# Patient Record
Sex: Female | Born: 1995 | Race: White | Hispanic: No | Marital: Single | Smoking: Never smoker
Health system: Southern US, Community
[De-identification: ages and names within clinical notes are randomized; demographics above are authoritative.]

---
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
## 2018-09-13 ENCOUNTER — Emergency Department: Payer: BLUE CROSS/BLUE SHIELD

## 2018-09-13 ENCOUNTER — Emergency Department
Admission: EM | Admit: 2018-09-13 | Discharge: 2018-09-13 | Disposition: A | Discharge status: Home / Self Care | Payer: BLUE CROSS/BLUE SHIELD | Attending: Emergency Medicine | Admitting: Emergency Medicine

## 2018-09-13 ENCOUNTER — Other Ambulatory Visit: Payer: Self-pay

## 2018-09-13 ENCOUNTER — Encounter: Payer: Self-pay | Admitting: *Deleted

## 2018-09-13 DIAGNOSIS — W0110XA Fall on same level from slipping, tripping and stumbling with subsequent striking against unspecified object, initial encounter: Secondary | ICD-10-CM | POA: Insufficient documentation

## 2018-09-13 DIAGNOSIS — Z79899 Other long term (current) drug therapy: Secondary | ICD-10-CM | POA: Insufficient documentation

## 2018-09-13 DIAGNOSIS — Y9301 Activity, walking, marching and hiking: Secondary | ICD-10-CM | POA: Diagnosis not present

## 2018-09-13 DIAGNOSIS — S4992XA Unspecified injury of left shoulder and upper arm, initial encounter: Secondary | ICD-10-CM | POA: Diagnosis present

## 2018-09-13 DIAGNOSIS — Y929 Unspecified place or not applicable: Secondary | ICD-10-CM | POA: Diagnosis not present

## 2018-09-13 DIAGNOSIS — S43005A Unspecified dislocation of left shoulder joint, initial encounter: Secondary | ICD-10-CM | POA: Diagnosis not present

## 2018-09-13 DIAGNOSIS — Y999 Unspecified external cause status: Secondary | ICD-10-CM | POA: Diagnosis not present

## 2018-09-13 MED ORDER — OXYCODONE-ACETAMINOPHEN 5-325 MG PO TABS
1.0000 | ORAL_TABLET | Freq: Once | ORAL | Status: AC
  Administered 2018-09-13: 1 via ORAL
  Filled 2018-09-13: qty 1

## 2018-09-13 MED ORDER — LIDOCAINE HCL (PF) 1 % IJ SOLN
20.0000 mL | Freq: Once | INTRAMUSCULAR | Status: AC
  Administered 2018-09-13: 20 mL
  Filled 2018-09-13: qty 20

## 2018-09-13 MED ORDER — ONDANSETRON HCL 4 MG/2ML IJ SOLN
4.0000 mg | Freq: Once | INTRAMUSCULAR | Status: AC
  Administered 2018-09-13: 4 mg via INTRAVENOUS
  Filled 2018-09-13: qty 2

## 2018-09-13 MED ORDER — MORPHINE SULFATE (PF) 4 MG/ML IV SOLN
4.0000 mg | Freq: Once | INTRAVENOUS | Status: AC
  Administered 2018-09-13: 4 mg via INTRAVENOUS
  Filled 2018-09-13: qty 1

## 2018-09-13 NOTE — Discharge Instructions (Signed)
Take ibuprofen or tylenol for pain. Apply ice.

## 2018-09-13 NOTE — ED Provider Notes (Signed)
[facility] Emergency Department Provider Note  ____________________________________________  Time seen: Approximately 6:02 PM  I have reviewed the triage vital signs and the nursing notes.   HISTORY  Chief Complaint Shoulder Injury    HPI Renee Richardson is a 23 y.o. female that presents to the emergency department that presents to the emergency department for evaluation of shoulder pain after fall today. Patient was walking on pavement when she tripped and fell due to a hole in her shoe. She landed directly on her shoulder. She cannot move her shoulder. No additional injuries.    History reviewed. No pertinent past medical history.  There are no active problems to display for this patient.   History reviewed. No pertinent surgical history.  Prior to Admission medications   Medication Sig Start Date End Date Taking? Authorizing Provider  meloxicam (MOBIC) 15 MG tablet Take one a day for 7 days then as needed 10/15/15   Ralene Corkraper, Timothy R, DO  norethindrone-ethinyl estradiol (MICROGESTIN,JUNEL,LOESTRIN) 1-20 MG-MCG tablet Take 1 tablet by mouth daily. 10/12/15   [provider]    Allergies Patient has no known allergies.  History reviewed. No pertinent family history.  Social History Social History   Tobacco Use  . Smoking status: Never Smoker  . Smokeless tobacco: Never Used  Substance Use Topics  . Alcohol use: Not on file  . Drug use: Not on file     Review of Systems  Cardiovascular: No chest pain. Respiratory: No SOB. Gastrointestinal: No nausea, no vomiting.  Musculoskeletal: Positive for shoulder pain.  Skin: Negative for rash, abrasions, lacerations, ecchymosis. Neurological: Negative for numbness or tingling   ____________________________________________   PHYSICAL EXAM:  VITAL SIGNS: ED Triage Vitals  Enc Vitals Group     BP 09/13/18 1736 (!) 150/89     Pulse Rate 09/13/18 1736 82     Resp 09/13/18 1736 18   Temp 09/13/18 1736 98 F (36.7 C)     Temp Source 09/13/18 1736 Oral     SpO2 09/13/18 1736 100 %     Weight 09/13/18 1736 125 lb (56.7 kg)     Height 09/13/18 1736 5\' 2"  (1.575 m)     Head Circumference --      Peak Flow --      Pain Score 09/13/18 1741 8     Pain Loc --      Pain Edu? --      Excl. in GC? --      Constitutional: Alert and oriented. Well appearing and in no acute distress. Eyes: Conjunctivae are normal. PERRL. EOMI. Head: Atraumatic. ENT:      Ears:      Nose: No congestion/rhinnorhea.      Mouth/Throat: Mucous membranes are moist.  Neck: No stridor.   Cardiovascular: Normal rate, regular rhythm.  Good peripheral circulation. Respiratory: Normal respiratory effort without tachypnea or retractions. Lungs CTAB. Good air entry to the bases with no decreased or absent breath sounds. Musculoskeletal: Full range of motion to all extremities. No gross deformities appreciated.  Limited range of motion of left shoulder due to pain. Neurologic:  Normal speech and language. No gross focal neurologic deficits are appreciated.  Skin:  Skin is warm, dry and intact. No rash noted. Psychiatric: Mood and affect are normal. Speech and behavior are normal. Patient exhibits appropriate insight and judgement.   ____________________________________________   LABS (all labs ordered are listed, but only abnormal results are displayed)  Labs Reviewed - No data to display ____________________________________________  EKG   ____________________________________________  RADIOLOGY Lexine Baton, personally viewed and evaluated these images (plain radiographs) as part of my medical decision making, as well as reviewing the written report by the radiologist.  Dg Shoulder Left  Result Date: 09/13/2018 CLINICAL DATA:  Patient tripped and fell earlier today landing on shoulder and now experiencing pain. EXAM: LEFT SHOULDER - 2+ VIEW COMPARISON:  10/15/2015 FINDINGS: Anterior  subcoracoid left humeral head dislocation is identified. The [facility] joint is maintained. The adjacent ribs and lung are nonacute. IMPRESSION: Anterior subcoracoid left humeral head dislocation. Electronically Signed   By: Tollie Eth M.D.   On: 09/13/2018 18:30    ____________________________________________    PROCEDURES  Procedure(s) performed:    Procedures     ____________________________________________   INITIAL IMPRESSION / ASSESSMENT AND PLAN / ED COURSE  Pertinent labs & imaging results that were available during my care of the patient were reviewed by me and considered in my medical decision making (see chart for details).  Review of the [place] CSRS was performed in accordance of the [facility] prior to dispensing any controlled drugs.   Patient's diagnosis is consistent with dislocation. Xray consistent with dislocation. Patient will be transferred to main ED for reduction. Report was given to Dr. Don Perking.        ____________________________________________  FINAL CLINICAL IMPRESSION(S) / ED DIAGNOSES  Final diagnoses:  None      NEW MEDICATIONS STARTED DURING THIS VISIT:  ED Discharge Orders    None          This chart was dictated using voice recognition software/Dragon. Despite best efforts to proofread, errors can occur which can change the meaning. Any change was purely unintentional.    Enid Derry, PA-C 09/13/18 1856    Don Perking, [place], MD 09/13/18 [phone]

## 2018-09-13 NOTE — ED Triage Notes (Signed)
Pt to ED reporting a fall earlier today. Pt now unable to move her left arm without increased pain. No hx of shoulder dislocation. No obvious deformity at this time. Radial pulse intact and left arm color is appropriate.

## 2018-09-13 NOTE — ED Notes (Signed)
Patient transported to X-ray 

## 2018-09-13 NOTE — ED Provider Notes (Signed)
[facility] Emergency Department Provider Note  ____________________________________________  Time seen: Approximately 6:53 PM  I have reviewed the triage vital signs and the nursing notes.   HISTORY  Chief Complaint Shoulder Injury   HPI Renee Richardson is a 23 y.o. female no significant past medical history who presents for evaluation of left shoulder pain.  Patient reports that she tripped and fell onto her shoulder just prior to arrival.  She is complaining of moderate constant pain worse with movement of the shoulder.  She denies head trauma or LOC.  No neck pain or back pain.  She denies any prior dislocation of her shoulder.  PMH None - reviewed  Prior to Admission medications   Medication Sig Start Date End Date Taking? Authorizing Provider  meloxicam (MOBIC) 15 MG tablet Take one a day for 7 days then as needed 10/15/15   Ralene Corkraper, Timothy R, DO  norethindrone-ethinyl estradiol (MICROGESTIN,JUNEL,LOESTRIN) 1-20 MG-MCG tablet Take 1 tablet by mouth daily. 10/12/15   [provider]    Allergies Patient has no known allergies.  History reviewed. No pertinent family history.  Social History Social History   Tobacco Use  . Smoking status: Never Smoker  . Smokeless tobacco: Never Used  Substance Use Topics  . Alcohol use: Not on file  . Drug use: Not on file    Review of Systems Constitutional: Negative for fever. Eyes: Negative for visual changes. ENT: Negative for facial injury or neck injury Cardiovascular: Negative for chest injury. Respiratory: Negative for shortness of breath. Negative for chest wall injury. Gastrointestinal: Negative for abdominal pain or injury. Genitourinary: Negative for dysuria. Musculoskeletal: Negative for back injury, + L shoulder pain Skin: Negative for laceration/abrasions. Neurological: Negative for head injury.   ____________________________________________   PHYSICAL EXAM:  VITAL SIGNS: ED  Triage Vitals  Enc Vitals Group     BP 09/13/18 1736 (!) 150/89     Pulse Rate 09/13/18 1736 82     Resp 09/13/18 1736 18     Temp 09/13/18 1736 98 F (36.7 C)     Temp Source 09/13/18 1736 Oral     SpO2 09/13/18 1736 100 %     Weight 09/13/18 1736 125 lb (56.7 kg)     Height 09/13/18 1736 5\' 2"  (1.575 m)     Head Circumference --      Peak Flow --      Pain Score 09/13/18 1741 8     Pain Loc --      Pain Edu? --      Excl. in GC? --     Constitutional: Alert and oriented. Well appearing and in no apparent distress. HEENT:      Head: Normocephalic and atraumatic.         Eyes: Conjunctivae are normal. Sclera is non-icteric.       Mouth/Throat: Mucous membranes are moist.       Neck: Supple with no signs of meningismus.  No C-spine tenderness Cardiovascular: Regular rate and rhythm. No murmurs, gallops, or rubs. 2+ symmetrical distal pulses are present in all extremities. No JVD. Respiratory: Normal respiratory effort. Lungs are clear to auscultation bilaterally. No wheezes, crackles, or rhonchi.  Gastrointestinal: Soft, non tender, and non distended with positive bowel sounds. No rebound or guarding. Musculoskeletal: Anterior dislocation of the L shoulder, intact neurovascular exam Neurologic: Normal speech and language. Face is symmetric. Moving all extremities. No gross focal neurologic deficits are appreciated. Skin: Skin is warm, dry and intact. No rash noted.  Psychiatric: Mood and affect are normal. Speech and behavior are normal.  ____________________________________________   LABS (all labs ordered are listed, but only abnormal results are displayed)  Labs Reviewed - No data to display ____________________________________________  EKG  none  ____________________________________________  RADIOLOGY  I have personally reviewed the images performed during this visit and I agree with the Radiologist's read.   Interpretation by Radiologist:  Dg Shoulder  Left  Result Date: 09/13/2018 CLINICAL DATA:  Reduction of shoulder dislocation EXAM: LEFT SHOULDER - 2+ VIEW COMPARISON:  09/13/2018 FINDINGS: Mild persistent anterior/inferior subluxation the left glenohumeral joint, improved from the prior study. No fracture. IMPRESSION: Mild persistent anterior/inferior subluxation of the left glenohumeral joint, improved from the prior study. Electronically Signed   By: Deatra Robinson M.D.   On: 09/13/2018 19:52   Dg Shoulder Left  Result Date: 09/13/2018 CLINICAL DATA:  Patient tripped and fell earlier today landing on shoulder and now experiencing pain. EXAM: LEFT SHOULDER - 2+ VIEW COMPARISON:  10/15/2015 FINDINGS: Anterior subcoracoid left humeral head dislocation is identified. The [facility] joint is maintained. The adjacent ribs and lung are nonacute. IMPRESSION: Anterior subcoracoid left humeral head dislocation. Electronically Signed   By: Tollie Eth M.D.   On: 09/13/2018 18:30     ____________________________________________   PROCEDURES  Procedure(s) performed:yes Reduction of dislocation Date/Time: 09/13/2018 8:41 PM Performed by: Nita Sickle, MD Authorized by: Nita Sickle, MD  Consent: Verbal consent obtained. Risks and benefits: risks, benefits and alternatives were discussed Consent given by: patient Patient understanding: patient states understanding of the procedure being performed Relevant documents: relevant documents present and verified Site marked: the operative site was marked Imaging studies: imaging studies available Required items: required blood products, implants, devices, and special equipment available Patient identity confirmed: verbally with patient Time out: Immediately prior to procedure a "time out" was called to verify the correct patient, procedure, equipment, support staff and site/side marked as required. Preparation: Patient was prepped and draped in the usual sterile fashion. Local anesthesia used:  yes Anesthesia: hematoma block  Anesthesia: Local anesthesia used: yes Local Anesthetic: lidocaine 1% without epinephrine Anesthetic total: 10 mL  Sedation: Patient sedated: no  Patient tolerance: Patient tolerated the procedure well with no immediate complications Comments: Reduced in the prone position with scapular manipulation and downward traction of the humerus.  After reduction reexamination of the joint shows resolution of step-off initially seen.  Patient is able to touch the opposing shoulder.       Critical Care performed:  None ____________________________________________   INITIAL IMPRESSION / ASSESSMENT AND PLAN / ED COURSE   23 y.o. female no significant past medical history who presents for evaluation of left shoulder pain status post mechanical fall.  X-ray confirms anterior dislocation of the left shoulder.  Neurovascular exam is intact.  No other injuries based on history and exam.  Shoulder was reduced per procedure note above  Clinical Course as of Sep 14 2039  Wed Sep 13, 2018  2006 Post reduction x-ray concerning for still mild subluxation of the joint.  On exam the joint seems to be back in place.  Patient is able to touch the opposite shoulder, anatomically looks in place.  I attempted to see if I was able to reduce further unsuccessfully.  I believe that what is seen on XR is actually the lidocaine injected in the joint prior to reduction. Discussed with Dr. Allena Katz, ortho who agrees that shoulder is back in place.  Patient also feels that her arm is back  in place.  At this time will discharge home with a shoulder immobilizer and follow-up with orthopedics.   [CV]    Clinical Course User Index [CV] Don Perking [place], MD     As part of my medical decision making, I reviewed the following data within the electronic MEDICAL RECORD NUMBER Nursing notes reviewed and incorporated, Radiograph reviewed , A consult was requested and obtained from this/these  consultant(s) Orthopedics, Notes from prior ED visits and [place] Controlled Substance Database    Pertinent labs & imaging results that were available during my care of the patient were reviewed by me and considered in my medical decision making (see chart for details).    ____________________________________________   FINAL CLINICAL IMPRESSION(S) / ED DIAGNOSES  Final diagnoses:  Shoulder dislocation, left, initial encounter      NEW MEDICATIONS STARTED DURING THIS VISIT:  ED Discharge Orders    None       Note:  This document was prepared using Dragon voice recognition software and may include unintentional dictation errors.    Nita Sickle, MD 09/13/18 2042

## 2020-04-20 IMAGING — CR DG SHOULDER 2+V*L*
3 series · 3 of 3 positions shown · non-contrast
Comparison: 10/15/2015

CLINICAL DATA: Patient tripped and fell earlier today landing on
shoulder and now experiencing pain.

EXAM:
LEFT SHOULDER - 2+ VIEW

[shoulder grashey]
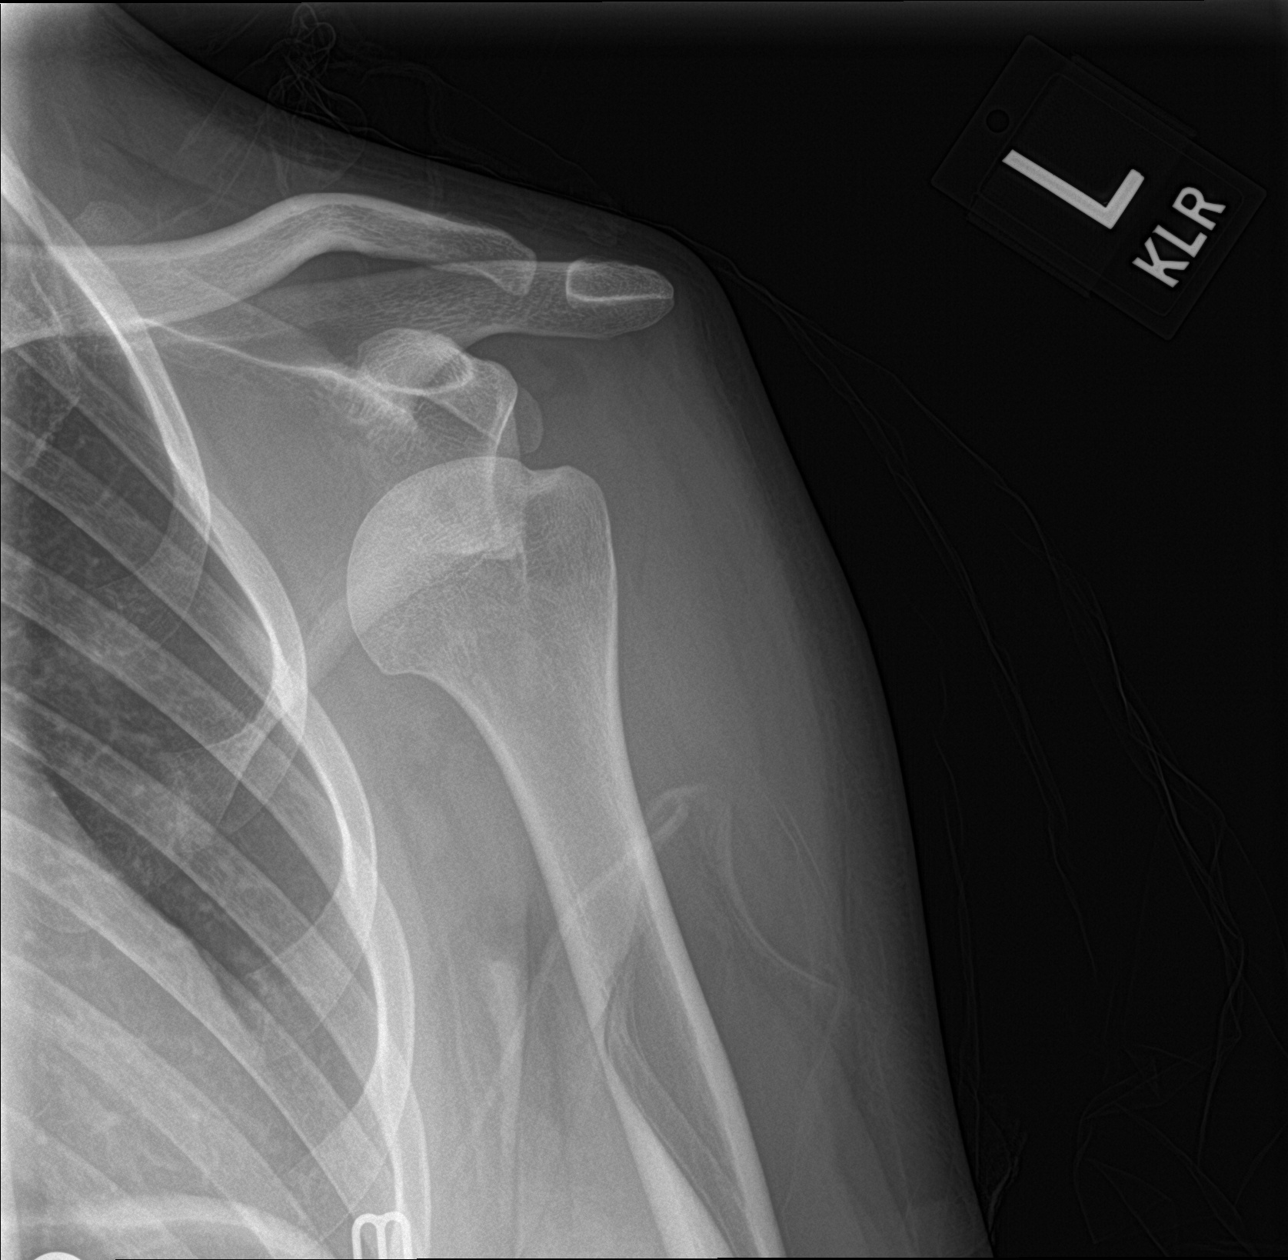

[shoulder y view]
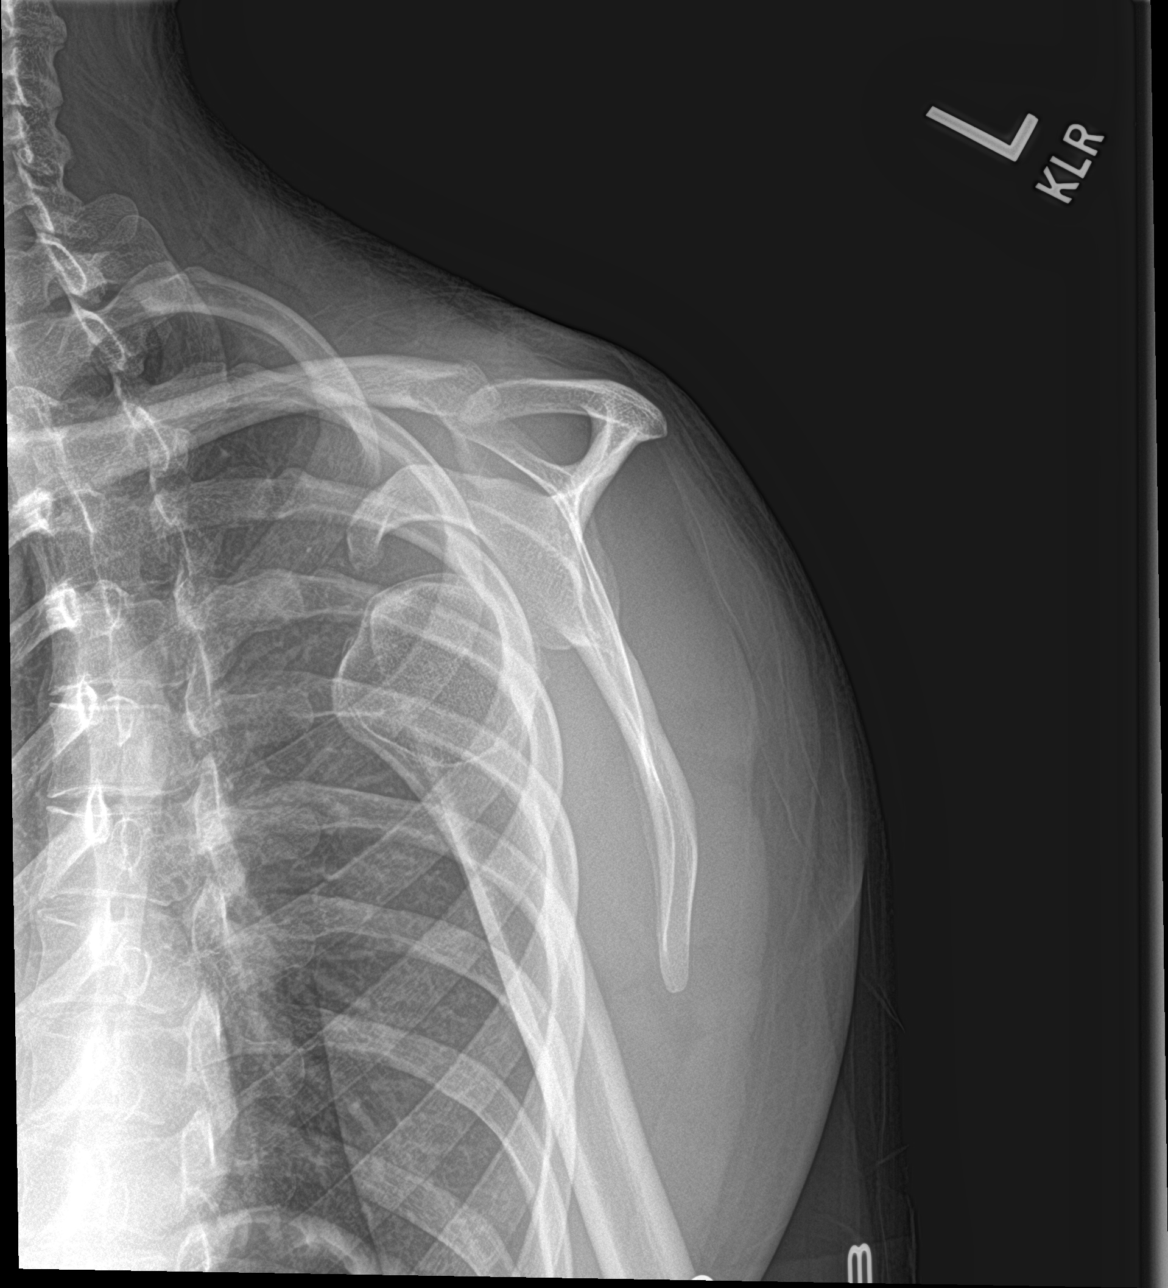

[shoulder axillary]
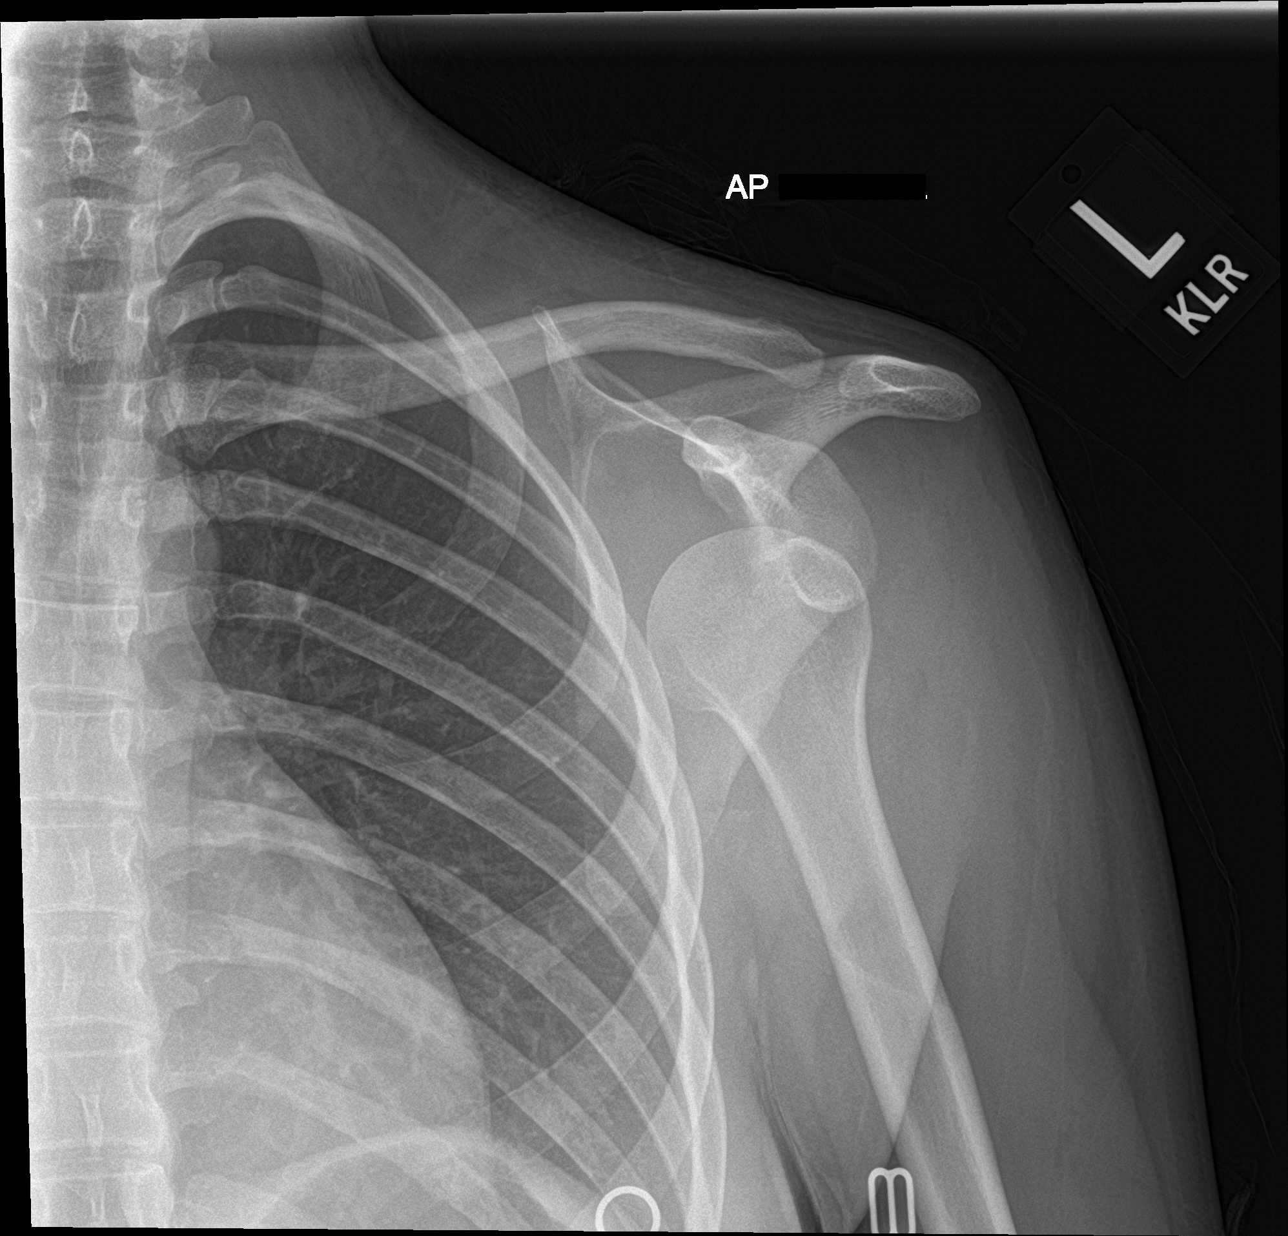

[3 of 3 positions shown; findings below may reference images not displayed]

FINDINGS: Anterior subcoracoid left humeral head dislocation is identified.
The AC joint is maintained. The adjacent ribs and lung are nonacute.
IMPRESSION: Anterior subcoracoid left humeral head dislocation.
